# Patient Record
Sex: Female | Born: 1935 | Race: Black or African American | Hispanic: No | Marital: Married
Health system: Southern US, Community
[De-identification: ages and names within clinical notes are randomized; demographics above are authoritative.]

---

## 2006-09-18 ENCOUNTER — Emergency Department: Payer: Self-pay | Admitting: Emergency Medicine

## 2006-09-18 ENCOUNTER — Other Ambulatory Visit: Payer: Self-pay

## 2006-12-12 ENCOUNTER — Inpatient Hospital Stay: Payer: Self-pay | Admitting: Internal Medicine

## 2006-12-12 ENCOUNTER — Other Ambulatory Visit: Payer: Self-pay

## 2007-06-17 ENCOUNTER — Emergency Department: Payer: Self-pay | Admitting: Emergency Medicine

## 2009-07-20 ENCOUNTER — Ambulatory Visit: Payer: Self-pay | Admitting: Internal Medicine

## 2009-07-21 ENCOUNTER — Inpatient Hospital Stay: Payer: Self-pay | Admitting: Internal Medicine

## 2009-08-19 ENCOUNTER — Ambulatory Visit: Payer: Self-pay | Admitting: Internal Medicine

## 2010-08-03 IMAGING — PT NM PET TUM IMG LTD AREA
5 series · 23 of 25 positions shown · non-contrast
Comparison: none

REASON FOR EXAM: lung cancer
COMMENTS:

[Series 3: ct wb 3.0 b30f · axial · 3.0mm · 0.98mm/px · z∈[-1299,-549]mm · 9 of 376 slices shown]
[im 1/376  soft-tissue]
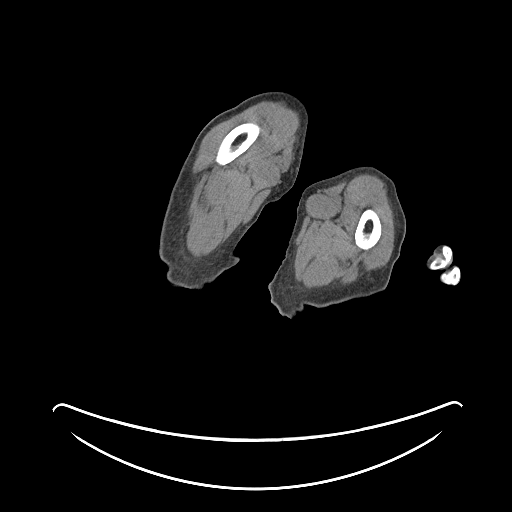
[im 42/376  soft-tissue]
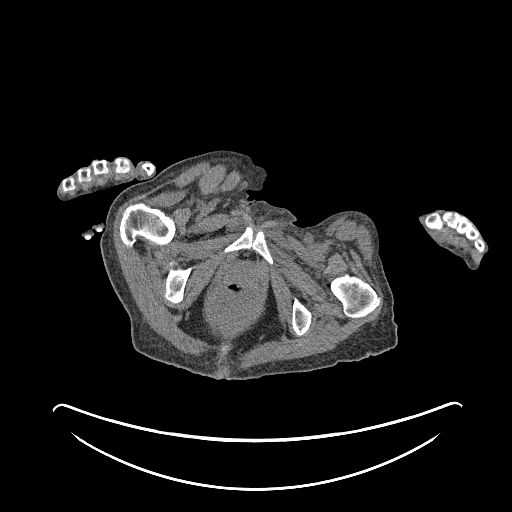
[im 84/376  soft-tissue]
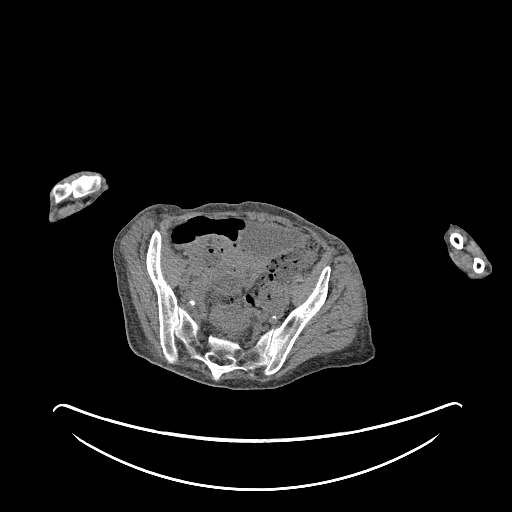
[im 126/376  soft-tissue]
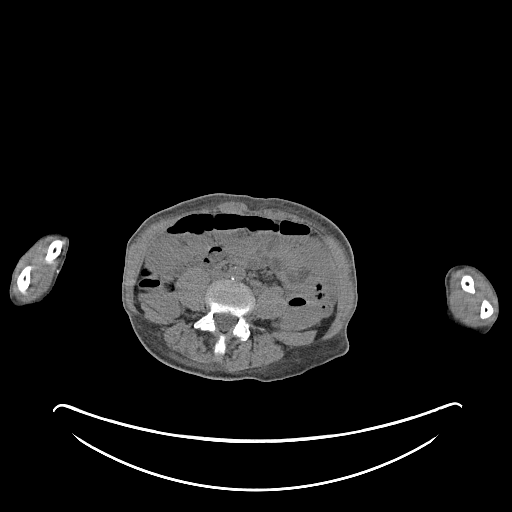
[im 167/376  soft-tissue]
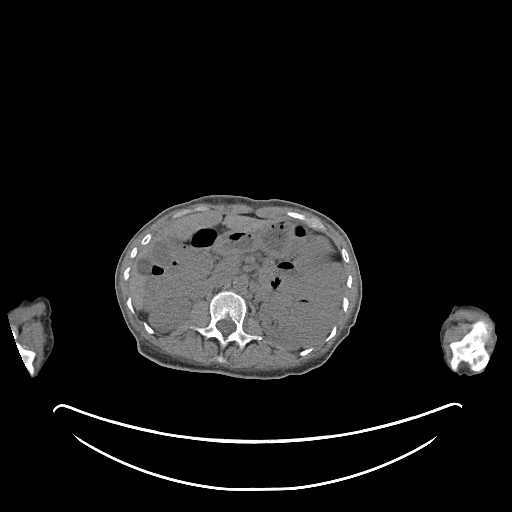
[im 209/376  soft-tissue]
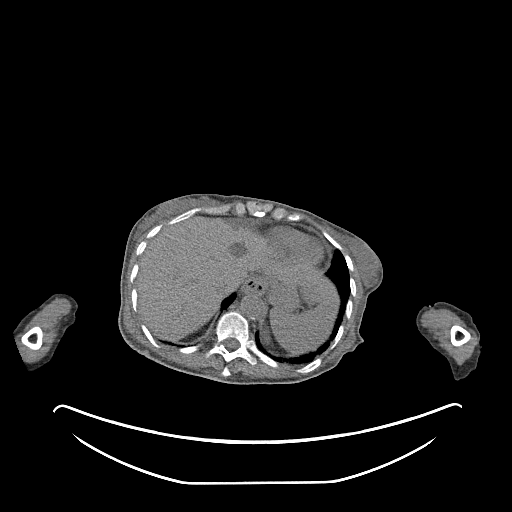
[im 292/376  soft-tissue]
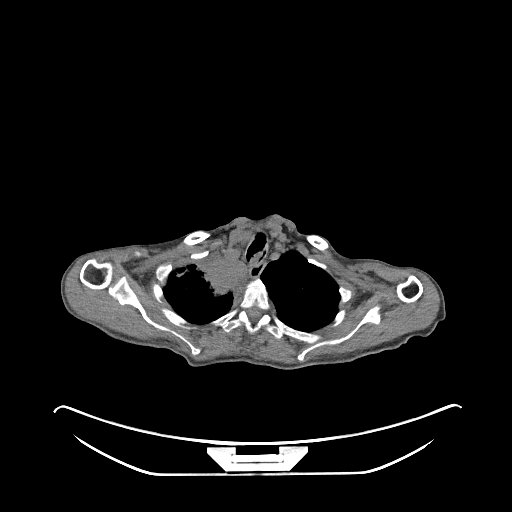
[im 334/376  soft-tissue]
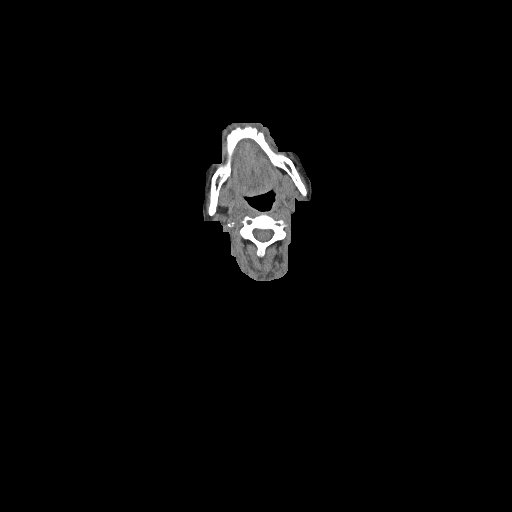
[im 376/376  soft-tissue]
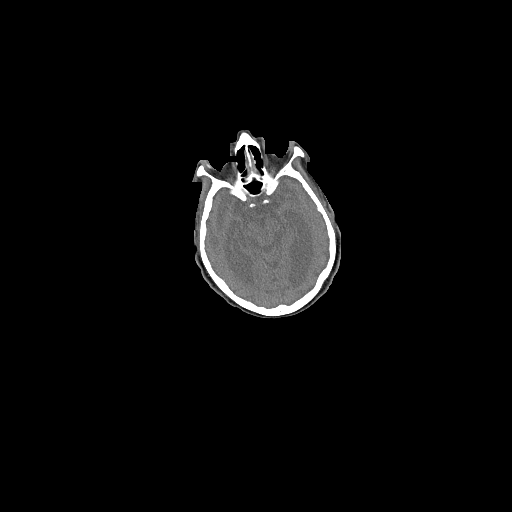

[Series 102: pet wb · axial · 5.0mm · 4.07mm/px · z∈[-1299,-549]mm · 7 of 251 slices shown]
[im 1/251]
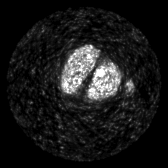
[im 42/251]
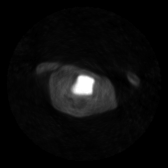
[im 84/251]
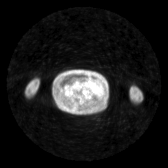
[im 126/251]
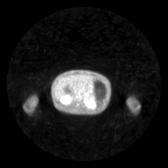
[im 167/251]
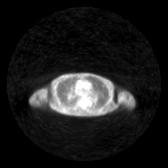
[im 209/251]
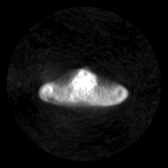
[im 251/251]
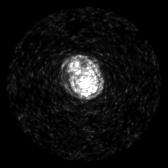

[Series 603: pet axial · 3 of 145 slices shown]
[im 1/145]
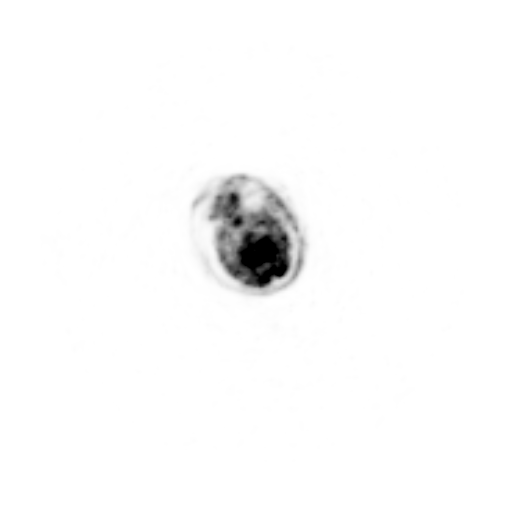
[im 97/145]
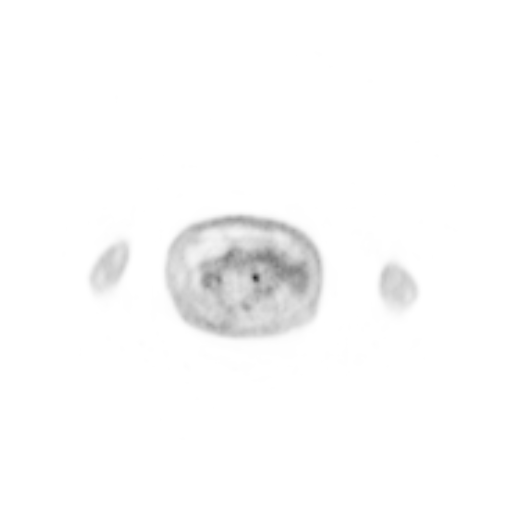
[im 145/145]
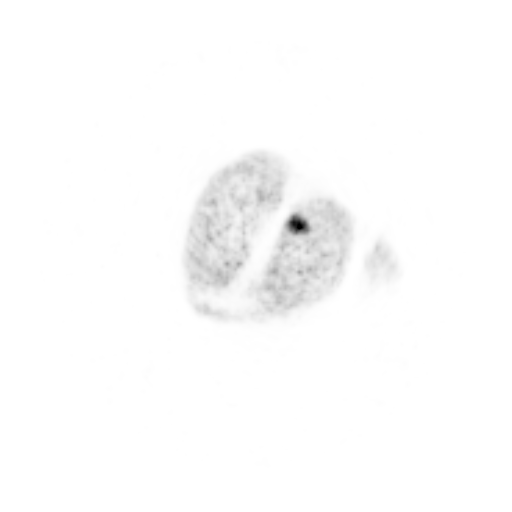

[Series 604: pet coronal · 1 of 55 slices shown]
[im 1/55]
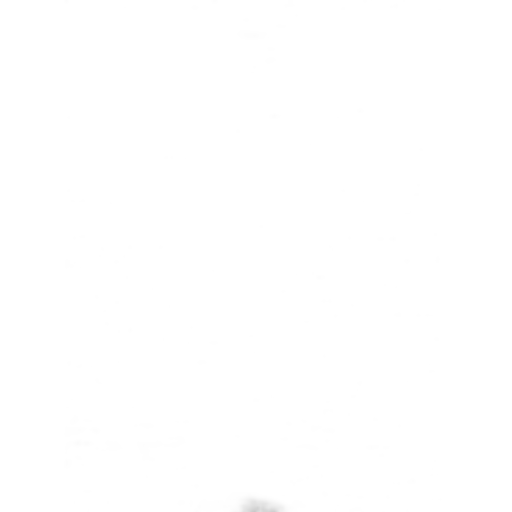

[Series 605: pet sagittal · 3 of 101 slices shown]
[im 1/101]
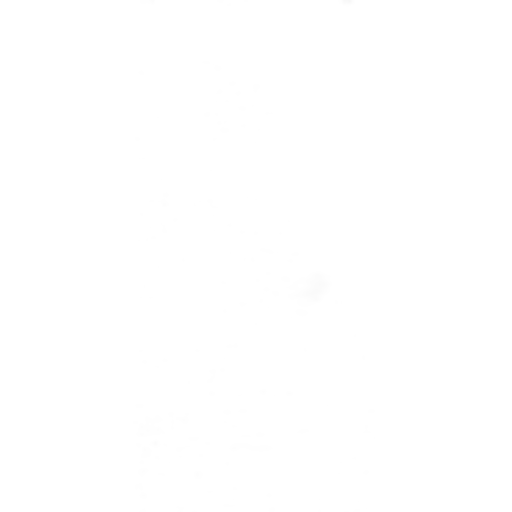
[im 51/101]
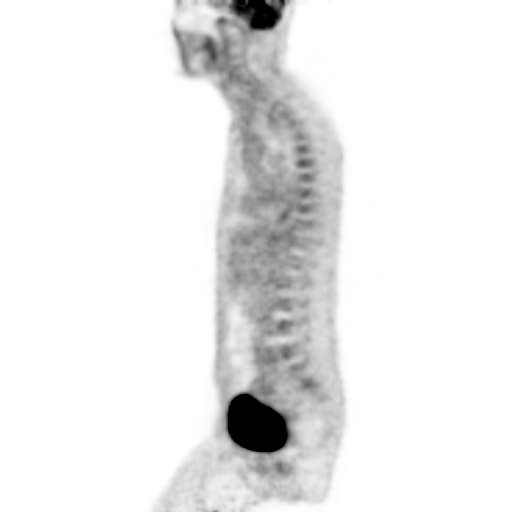
[im 101/101]
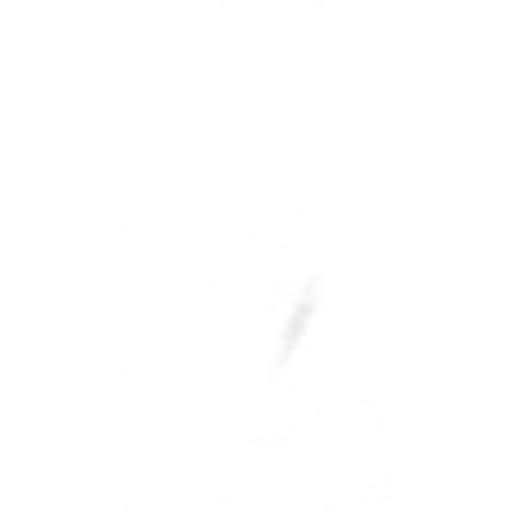

[23 of 25 positions shown; findings below may reference images not displayed]

PROCEDURE:     PET - PET/CT DX LUNG CA  - July 30, 2009 [DATE]

RESULT:     The patient is undergoing restaging of lung malignancy. The
patient has diagnosis of pneumonia and dementia and was nonresponsive for
this study. The patient's fasting blood glucose level was 69 mg/dL. The
patient received 11.65 mCi of fluorine 18 labeled fluorodeoxyglucose at 910
a.m. through a left biceps region vein. The patient did not receive a 250 cc
flush of normal saline. A noncontrast CT scan was performed from the base of
the brain to the upper thighs for coregistration and attenuation correction.

Reference is made to the CT scan 22 July, 2009 which revealed a large
mass in the right upper hemithorax. On today's study there is a large area
of abnormal uptake in the right hemithorax superiorly. This corresponds to
the get mass seen on the CT scan in the anterior aspect of the midportion of
the upper lobe. The maximal SUV value demonstrated today is 6.1 SUV units.
Outside of the right upper lobe I do not see abnormal uptake of the
radiopharmaceutical within the lungs. Within the mediastinum and hilar
regions no abnormal uptake is seen.

Activity within the neck as well as within the abdomen and pelvis is within
the limits of normal. There is a focus of increased uptake adjacent to the
inferior aspect of the issue him on the left. This appears to be centered
within the soft tissues. A discrete abnormal mass here is not seen on the CT
images but there is slightly increased prominence of the soft tissues here
as compared to the right side. The finding here is nonspecific. This is not
clearly within the bone.
IMPRESSION: 1. There is intensely increased uptake of the radiopharmaceutical within the
large mass in the anterior aspect of the right upper lobe previously
demonstrated on CT scan. I do not see evidence of significant mediastinal or
hilar when nodes question lymphadenopathy.
2. There is a nonspecific focus of mildly increased uptake adjacent to the
inferior aspect of the left ischium which may be muscular in nature or could
reflect an involved lymph node but this is felt to be less likely.
3. I do not see abnormal uptake within the liver or adrenal regions or
elsewhere within the abdomen or pelvis.

## 2012-06-20 ENCOUNTER — Emergency Department: Payer: Self-pay | Admitting: Emergency Medicine

## 2012-06-20 LAB — COMPREHENSIVE METABOLIC PANEL
Albumin: 3.5 g/dL (ref 3.4–5.0)
Alkaline Phosphatase: 75 U/L (ref 50–136)
BUN: 13 mg/dL (ref 7–18)
Bilirubin,Total: 0.5 mg/dL (ref 0.2–1.0)
Calcium, Total: 8.9 mg/dL (ref 8.5–10.1)
Chloride: 108 mmol/L — ABNORMAL HIGH (ref 98–107)
EGFR (African American): >60
EGFR (Non-African Amer.): >60
Glucose: 86 mg/dL (ref 65–99)
Potassium: 4.1 mmol/L (ref 3.5–5.1)
SGOT(AST): 25 U/L (ref 15–37)
Sodium: 143 mmol/L (ref 136–145)

## 2012-06-20 LAB — URINALYSIS, COMPLETE
Bilirubin,UR: NEGATIVE
Blood: NEGATIVE
Glucose,UR: NEGATIVE mg/dL (ref 0–75)
Ketone: NEGATIVE
Ph: 5 (ref 4.5–8.0)
RBC,UR: NONE SEEN /HPF (ref 0–5)
Squamous Epithelial: 388
WBC UR: 49 /HPF (ref 0–5)

## 2012-06-20 LAB — CBC
HCT: 43.8 % (ref 35.0–47.0)
HGB: 14.7 g/dL (ref 12.0–16.0)
MCH: 32.5 pg (ref 26.0–34.0)
MCHC: 33.6 g/dL (ref 32.0–36.0)
WBC: 7.2 10*3/uL (ref 3.6–11.0)

## 2013-08-19 ENCOUNTER — Ambulatory Visit: Payer: Self-pay | Admitting: Internal Medicine

## 2013-09-15 ENCOUNTER — Inpatient Hospital Stay: Payer: Self-pay | Admitting: Internal Medicine

## 2013-09-15 LAB — COMPREHENSIVE METABOLIC PANEL
BUN: 17 mg/dL (ref 7–18)
Bilirubin,Total: 0.4 mg/dL (ref 0.2–1.0)
Chloride: 109 mmol/L — ABNORMAL HIGH (ref 98–107)
Creatinine: 0.49 mg/dL — ABNORMAL LOW (ref 0.60–1.30)
EGFR (African American): >60
EGFR (Non-African Amer.): >60
Glucose: 100 mg/dL — ABNORMAL HIGH (ref 65–99)
Osmolality: 281 (ref 275–301)
SGPT (ALT): 107 U/L — ABNORMAL HIGH (ref 12–78)
Sodium: 140 mmol/L (ref 136–145)

## 2013-09-15 LAB — CBC
HGB: 13.7 g/dL (ref 12.0–16.0)
MCHC: 32.6 g/dL (ref 32.0–36.0)
RBC: 4.36 10*6/uL (ref 3.80–5.20)
RDW: 13.7 % (ref 11.5–14.5)

## 2013-09-15 LAB — PROTIME-INR
INR: 1.1
Prothrombin Time: 14 secs (ref 11.5–14.7)

## 2013-10-20 ENCOUNTER — Ambulatory Visit: Payer: Self-pay | Admitting: Internal Medicine

## 2013-10-20 DEATH — deceased

## 2015-01-09 NOTE — Discharge Summary (Signed)
PATIENT NAME:  Kirsten SheehanFRANCIS, Kirsten MR#:  [id number] DATE OF BIRTH:  1935/11/07  DATE OF ADMISSION:  09/15/2013 DATE OF DISCHARGE:  09/17/2013  ADMISSION DIAGNOSIS: Respiratory failure with hemoptysis.   DISCHARGE DIAGNOSES:  1. Respiratory failure with hemoptysis.  2. 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   CONSULTS: Palliative care.   HOSPITAL COURSE: This is a 79 year old female who was brought in on 09/15/2013 with severe hemoptysis and respiratory distress with a history of primary bronchogenic cancer and dementia. She was admitted actually on comfort care as per the wishes of the family. She was placed on comfort care and now is being sent to hospice home.   DISCHARGE MEDICATIONS:  1. Ativan 0.5 mg 1 to 2 tablets 2 to 4 hours p.r.n.  2. Morphine 0.25 mg q.1 to 2 hours.   The patient will be transferred to hospice home.   TIME SPENT: 35 minutes.   ____________________________ Janyth ContesSital P. Juliene PinaMody, MD spm:gb D: 09/17/2013 13:50:03 ET T: 09/17/2013 19:31:31 ET JOB#: [id number]  cc: Kirsten Osuna P. Juliene PinaMody, MD, <Dictator> Janyth ContesSITAL P Veneda Kirksey MD ELECTRONICALLY SIGNED 09/18/2013 16:10

## 2015-01-09 NOTE — H&P (Signed)
PATIENT NAME:  Kirsten Le, Kirsten Le MR#:  [id number] DATE OF BIRTH:  Feb 07, 1936  DATE OF ADMISSION:  09/15/2013  REASON FOR ADMISSION: Severe hemoptysis. The patient in severe respiratory distress with a history of primary bronchogenic cancer and dementia.   PRIMARY CARE PHYSICIAN: Serita Shellerrnest B. Maryellen PileEason, MD  ONCOLOGIST Knute Neuobert G. Lorre NickGittin, MD  REFERRING PHYSICIAN:  Randon Goldsmithebecca L. Lord, MD  HISTORY OF PRESENT ILLNESS: This is a very nice 79 year old female who has history of primary bronchogenic cancer diagnosed back in 2012. She has been stable and apparently her mass has shrunk. She did not proceed with any aggressive chemotherapy or radiotherapy and she has been on palliative care since then. Today, the patient comes up with significant cough, bringing up large amounts of blood. This problem started at 3:00 a.m. this morning. She lives at home with her dad and daughter, who is her caregiver, and they would like to take care of her at home but since she is coughing up some large amounts of blood they decided to bring her to the Emergency Department. The patient has been in severe distress for what we are trying to do some therapies to clear up her airway. She was severely dehydrated, for what she got a liter of fluids. She was severely tachycardic and in distress, now we started her on medications to help her out with comfort measures. She is 77. Her presentation 4 years ago was of cough and weakness. She presented with sepsis syndrome and a possible pneumonia and pneumonitis, and then she was started on treatment with antibiotics at that moment for a possible pneumonia versus mass. A CT scan was done and it showed a possibility of mass on the right upper lobe and at that moment, she was not having any frank hemoptysis. She had a consultation with Dr. Lorre NickGittin who offered some possible treatments but the family opted to do mostly comfort care. She has been good since then but today she comes with this severe problem which is  deadly.   PAST MEDICAL HISTORY:  1.  Primary bronchogenic cancer.  2.  History of previous pneumonias.  3.  History of syncope.  4.  Severe advanced Alzheimer's dementia with debilitation.  5.  Chronic malnutrition.   PAST SURGICAL HISTORY: None.    ALLERGIES: No known drug allergies.   FAMILY HISTORY: Unknown and unable to obtain due to unable to the patient unable to give me information. The family, at this moment, is very upset. They said there is nothing like cancer or coronary disease in the family.   SOCIAL HISTORY: The patient lives at home with her husband and has a full-time caretaker.   REVIEW OF SYSTEMS: Unable to obtain due to patient's altered mental status.   PHYSICAL EXAMINATION: VITAL SIGNS: Initial vital signs show blood pressure of 100/59, pulse of 131. Oxygen saturation down to the low 80s, only improved to 90% with 2 liters nasal cannula, down to 85% a couple of times with hemoptysis and coughing spells despite the oxygen. Respiratory rate up to 26. Temperature 99.1.  GENERAL: The patient is obtunded at this moment with significant distress when she coughs and when she spits up large amounts of blood. There is a canister full of blood in the room. Again, the patient uses accessory muscles and seems very uncomfortable whenever the episodes of hemoptysis happen. She is severely ill and malnourished.  HEENT: Her pupils are sluggish, reactive. Extraocular movements unable to assess. No oral lesions. There are large amounts of blood in  her mouth.  NECK: Supple. No JVD. No thyromegaly. No adenopathy. No masses.  CARDIOVASCULAR: Regular rate and rhythm. No murmurs, rubs or gallops. No displacement of PMI.  LUNGS: Significant rales and crepitus dispersed on all respiratory fields. Positive use of accessory muscles.  ABDOMEN: Soft, nontender, nondistended. No hepatosplenomegaly. No masses. Bowel sounds are normal.  GENITAL: Deferred.  EXTREMITIES: No edema, cyanosis or  clubbing.  SKIN: Dry skin. The patient looks severely dehydrated, debilitated.  LYMPHATIC: Negative for lymphadenopathy in the neck.  VASCULAR: Capillary refill is about 6 seconds.  NEUROLOGIC: Unable to fully assess II through XII.  MENTAL STATUS: The patient going into comfort measurements only now. She does not withdraw to pain.  PSYCHIATRIC: Unable to assess as well.  MUSCULOSKELETAL: No joint deformity.   RESULTS: Creatinine 0.49, glucose 100, BUN 17, potassium 4.1, chloride 109, albumin 2.9, AST 51, ALT 107. White blood count 11.2, hemoglobin is 13. INR 1.1.  O positive blood.   ASSESSMENT AND PLAN: This is a very nice 79 year old demented lady with primary bronchogenic cancer, who is here due to severe hemoptysis, acute respiratory failure.  1.  Acute respiratory failure. This is secondary to large amounts of blood coming out of her airway. We tried to clear her airway with suction and the patient continues to have significant amount of blood. She is in severe distress. Measurements for palliative care have been started, but first we are trying to stabilize the patient.  Since the patient is suffering so much, I am going to do last measurement is going to be starting aminocaproic acid/Amicar and we are going to give her a bolus to decrease the bleeding. Again, this is not going to be to save her life, it is going to be to keep her under comfort and prevent her from choke from bleeding. That is going to be my main concern to address, address her pain with morphine. We are going to put her on a morphine drip. Family accepts that.  That will help her breathing pattern as well and her pain.  2.  She is not able to tell me much about if she is in pain, for what I believe a morphine drip will be the best option to keep her comfortable. Advance or increase the amount as the patient needs it.  3.  Severe hemoptysis. Amicar given.  4.  The patient is severely ill. She is having a deadly situation right  now, which s going to be irreversible. We tried to work on her airway clearance. The patient is severely ill and she is going to be switched to comfort care only now.   CRITICAL CARE TIME: 35 minutes.    ____________________________ Felipa Furnace, MD rsg:cs D: 09/15/2013 13:24:15 ET T: 09/15/2013 14:48:30 ET JOB#: [id number]  cc: Felipa Furnace, MD, <Dictator> Delpha Perko Juanda Chance MD ELECTRONICALLY SIGNED 09/16/2013 0:59
